# Patient Record
Sex: Female | Born: 1995 | Race: White | Hispanic: No | Marital: Single | Smoking: Never smoker
Health system: Southern US, Community
[De-identification: ages and names within clinical notes are randomized; demographics above are authoritative.]

## PROBLEM LIST (undated history)

## (undated) DIAGNOSIS — L309 Dermatitis, unspecified: Secondary | ICD-10-CM

## (undated) HISTORY — DX: Dermatitis, unspecified: L30.9

---
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
## 2017-04-27 ENCOUNTER — Ambulatory Visit (INDEPENDENT_AMBULATORY_CARE_PROVIDER_SITE_OTHER): Payer: Commercial Managed Care - PPO | Admitting: Allergy

## 2017-04-27 ENCOUNTER — Encounter: Payer: Self-pay | Admitting: Allergy

## 2017-04-27 VITALS — BP 110/70 | HR 95 | Temp 97.6°F | Resp 18 | Ht 61.0 in | Wt 108.8 lb

## 2017-04-27 DIAGNOSIS — R0789 Other chest pain: Secondary | ICD-10-CM | POA: Diagnosis not present

## 2017-04-27 DIAGNOSIS — L508 Other urticaria: Secondary | ICD-10-CM

## 2017-04-27 DIAGNOSIS — T783XXD Angioneurotic edema, subsequent encounter: Secondary | ICD-10-CM

## 2017-04-27 MED ORDER — RANITIDINE HCL 150 MG PO TABS: 150.0000 mg | ORAL_TABLET | Freq: Two times a day (BID) | ORAL | 5 refills | Status: AC

## 2017-04-27 MED ORDER — ALBUTEROL SULFATE HFA 108 (90 BASE) MCG/ACT IN AERS: 2.0000 | INHALATION_SPRAY | Freq: Four times a day (QID) | RESPIRATORY_TRACT | 1 refills | Status: AC | PRN

## 2017-04-27 MED ORDER — CETIRIZINE HCL 10 MG PO TABS: 10.0000 mg | ORAL_TABLET | Freq: Two times a day (BID) | ORAL | 5 refills | Status: AC

## 2017-04-27 NOTE — Progress Notes (Signed)
New Patient Note  RE: Sheila Davidson MRN: [id number] DOB: Feb 23, 1996 Date of Office Visit: 04/27/2017  Referring provider: Liberty Handy, NP Primary care provider: Liberty Handy, NP  Chief Complaint: Hives  History of present illness: Sheila Davidson is a 21 y.o. female presenting today for consultation for hives.  She presents today with her friend.    For past 3 months she reports having hives from "head to toe".  She reports she has never had such severe hives before.  The only thing she reports has helped is steroids.  She has [facility] and [facility] her [facility].     She reports at the start of the hives she only would notice hives at night.  Thus she is thought it may have been related to the detergent she was washing her linen in.  She tried changing her detergents and she still had development of hives.  She states she has tried washing her clothes with just water and she does feel like she has less hives if she does this but it does not completely resolve her symptoms.  She denies any preceding illness, no new foods, no new medications, no sting, no change in soaps/lotions/detergents before the hives started.  Hives last a few hours.  Does not leave any mark or bruising, no fevers.  She does endorse lip/facial and feet swelling as well as joint ache/pain especially hands/fingers.  She has noted that warm showers makes her itchier.   There is pressure component as she tends to have more hives around her bra line.  She also notes that her hives are worse when she is more stressed and anxious which is most of the time.  She has tried zyrtec, Allegra, claritin all one at a time taking 1 tab daily as well a benadryl.   She states this has not done anything to help with her hives.  She has had 2 steroid injections one in August and the other in Sept and has had oral steroids x 3 times.     She does have a history of eczema for which she is a  topical steroid that she does not remember at this time.  She states that the steroid does help when she uses it.     She does report having hives related to bee stings and some medications including penicillin and ciprofloxacin.   She does not have a history of asthma however she has noted some chest tightness sensation since the hives started and she was given an albuterol as she states her PCP noted hives over her neck and wanted her to have the inhaler.  She states she has used the albuterol about 2-3 times with the chest tightness sensation and it has helped to relieve the symptoms.  She denies any nighttime awakenings.  She denies any GI or CV related symptoms alongside the hives.  She has no history of seasonal or environmental allergy and no history of food allergy or concerns for food allergy.  She does eat red meat about twice a week and is unsure if she has been bitten by a tick before.  Review of systems: Review of Systems  Constitutional: Negative for chills, fever and malaise/fatigue.  HENT: Negative for congestion, ear discharge, ear pain, nosebleeds, sinus pain, sore throat and tinnitus.   Eyes: Negative for pain, discharge and redness.  Respiratory: Negative for cough, shortness of breath and wheezing.   Cardiovascular:  Positive for chest pain (Chest tightness sensation).  Gastrointestinal: Negative for abdominal pain, constipation, diarrhea, heartburn, nausea and vomiting.  Musculoskeletal: Positive for joint pain. Negative for back pain, myalgias and neck pain.  Skin: Positive for itching and rash.  Neurological: Negative for headaches.    All other systems negative unless noted above in HPI  Past medical history: Past Medical History:  Diagnosis Date  . Eczema     Past surgical history: History reviewed. No pertinent surgical history.  Family history:  History reviewed. No pertinent family history.  Social history: She lives in an apartment with carpeting with  electric heating and central cooling.  There is a dog, cat and hamster in the home.  There is no concern for water damage, mildew or roaches in the home.  She works as a Psychologist, sport and exercisedesk associate.  She is also a Consulting civil engineerstudent at [employer].  She has no smoking history.   Medication List: Allergies as of 04/27/2017      Reactions   Bee Venom Swelling   Ciprofloxacin Hives   Penicillins Hives      Medication List       Accurate as of 04/27/17  5:02 PM. Always use your most recent med list.          buPROPion 100 MG 12 hr tablet Commonly known as:  WELLBUTRIN SR Take 150 mg by mouth daily.   busPIRone 10 MG tablet Commonly known as:  BUSPAR Take by mouth.   clotrimazole 1 % cream Commonly known as:  LOTRIMIN Apply topically.   lamoTRIgine 100 MG tablet Commonly known as:  LAMICTAL 100mg  orally in the am   traZODone 50 MG tablet Commonly known as:  DESYREL Take 100 mg by mouth daily.       Known medication allergies: Allergies  Allergen Reactions  . Bee Venom Swelling  . Ciprofloxacin Hives  . Penicillins Hives     Physical examination: Blood pressure 110/70, pulse 95, temperature 97.6 F (36.4 C), resp. rate 18, height 5\' 1"  (1.549 m), weight 108 lb 12.8 oz (49.4 kg), SpO2 96 %.  General: Alert, interactive, in no acute distress. HEENT: PERRLA,  TMs pearly gray, turbinates non-edematous without discharge, post-pharynx non erythematous. Neck: Supple without lymphadenopathy. Lungs: Clear to auscultation without wheezing, rhonchi or rales. {no increased work of breathing. CV: Normal S1, S2 without murmurs. Abdomen: Nondistended, nontender. Skin: Scattered erythematous urticarial type lesions primarily located Arms bilaterally, abdomen, back, legs bilaterally , nonvesicular. Extremities:  No clubbing, cyanosis or edema. Neuro:   Grossly intact.  Diagnositics/Labs:   Spirometry: FEV1: 2.47L 82%, FVC: 2.57L  76%  Allergy testing: Deferred due to ongoing  urticaria   Assessment and plan:   Hives and swelling   - at this time etiology of hives and swelling is unknown.  Hives can be caused by a variety of different triggers including illness/infection, foods, medications, stings, exercise, pressure, vibrations, extremes of temperature to name a few however majority of the time there is no identifiable trigger.  Your symptoms have been ongoing for >6 weeks making this chronic thus will obtain labwork to evaluate: CBC w diff, CMP, tryptase, hive panel, environmental panel, alpha-gal panel, inflammatory markers   - start Zyrtec 10mg  twice a day as well as Zantac 150mg  twice a day.  If you still have breakthrough symptoms increase Zyrtec to 2 tabs twice a day (this is the max dose).     - may use benadryl as needed for breakthrough symptoms.    - let  us know if hives and swelling start to leave any marks/bruising or you are having any fevers    - Xolair monthly injection discussed today and potential treatment option of oral medications above are not effective enough in managing symptoms.    Chest tightness   - have access to albuterol inhaler 2 puffs every 4-6 hours as needed for cough/wheeze/shortness of breath/chest tightness.  May use 15-20 minutes prior to activity.   Monitor frequency of use.    Follow-up 1-2 months or sooner if needed   I appreciate the opportunity to take part in Sheila Davidson's care. Please do not hesitate to contact me with questions.  Sincerely,   Margo Aye, MD Allergy/Immunology [facility]

## 2017-04-27 NOTE — Patient Instructions (Addendum)
Hives and swelling   - at this time etiology of hives and swelling is unknown.  Hives can be caused by a variety of different triggers including illness/infection, foods, medications, stings, exercise, pressure, vibrations, extremes of temperature to name a few however majority of the time there is no identifiable trigger.  Your symptoms have been ongoing for >6 weeks making this chronic thus will obtain labwork to evaluate: CBC w diff, CMP, tryptase, hive panel, environmental panel, alpha-gal panel, inflammatory markers   - start Zyrtec 10mg  twice a day as well as Zantac 150mg  twice a day.  If you still have breakthrough symptoms increase Zyrtec to 2 tabs twice a day (this is the max dose).     - may use benadryl as needed for breakthrough symptoms.    - let us know if hives and swelling start to leave any marks/bruising or you are having any fevers    - Xolair monthly injection discussed today and potential treatment option of oral medications above are not effective enough in managing symptoms.    Chest tightness   - have access to albuterol inhaler 2 puffs every 4-6 hours as needed for cough/wheeze/shortness of breath/chest tightness.  May use 15-20 minutes prior to activity.   Monitor frequency of use.    Follow-up 1-2 months or sooner if needed

## 2017-05-04 LAB — IGE+ALLERGENS ZONE 2(30)
Amer Sycamore IgE Qn: <0.1 kU/L
Aspergillus Fumigatus IgE: <0.1 kU/L
Cat Dander IgE: <0.1 kU/L
Cockroach, American IgE: <0.1 kU/L
Common Silver Birch IgE: <0.1 kU/L
D Farinae IgE: <0.1 kU/L
Dog Dander IgE: <0.1 kU/L
IGE (IMMUNOGLOBULIN E), SERUM: 21 [IU]/mL (ref 0–100)
Johnson Grass IgE: <0.1 kU/L
Maple/Box Elder IgE: <0.1 kU/L
Mucor Racemosus IgE: <0.1 kU/L
Mugwort IgE Qn: <0.1 kU/L
Oak, White IgE: <0.1 kU/L
Sheep Sorrel IgE Qn: <0.1 kU/L
Stemphylium Herbarum IgE: <0.1 kU/L
Sweet gum IgE RAST Ql: <0.1 kU/L
Timothy Grass IgE: <0.1 kU/L
White Mulberry IgE: <0.1 kU/L

## 2017-05-04 LAB — ALPHA-GAL PANEL
Beef (Bos spp) IgE: <0.1 kU/L (ref <0.35)
Class Interpretation: 0
Class Interpretation: 0
Lamb/Mutton (Ovis spp) IgE: <0.1 kU/L (ref <0.35)
PORK CLASS INTERPRETATION: 0

## 2017-05-04 LAB — COMPREHENSIVE METABOLIC PANEL
ALBUMIN: 4.2 g/dL (ref 3.5–5.5)
ALT: 24 IU/L (ref 0–32)
AST: 25 IU/L (ref 0–40)
Albumin/Globulin Ratio: 2.2 (ref 1.2–2.2)
Alkaline Phosphatase: 61 IU/L (ref 39–117)
BILIRUBIN TOTAL: 0.5 mg/dL (ref 0.0–1.2)
BUN / CREAT RATIO: 20 (ref 9–23)
BUN: 12 mg/dL (ref 6–20)
CALCIUM: 9.8 mg/dL (ref 8.7–10.2)
CHLORIDE: 104 mmol/L (ref 96–106)
CO2: 25 mmol/L (ref 20–29)
CREATININE: 0.61 mg/dL (ref 0.57–1.00)
GFR, EST AFRICAN AMERICAN: 152 mL/min/{1.73_m2} (ref >59)
GFR, EST NON AFRICAN AMERICAN: 132 mL/min/{1.73_m2} (ref >59)
GLUCOSE: 85 mg/dL (ref 65–99)
Globulin, Total: 1.9 g/dL (ref 1.5–4.5)
Potassium: 4.2 mmol/L (ref 3.5–5.2)
Sodium: 140 mmol/L (ref 134–144)
TOTAL PROTEIN: 6.1 g/dL (ref 6.0–8.5)

## 2017-05-04 LAB — TRYPTASE: Tryptase: 12.5 ug/L (ref 2.2–13.2)

## 2017-05-04 LAB — CBC WITH DIFFERENTIAL/PLATELET
BASOS ABS: 0 10*3/uL (ref 0.0–0.2)
Basos: 0 %
EOS (ABSOLUTE): 0.2 10*3/uL (ref 0.0–0.4)
Eos: 3 %
HEMOGLOBIN: 13.9 g/dL (ref 11.1–15.9)
Hematocrit: 40.2 % (ref 34.0–46.6)
IMMATURE GRANS (ABS): 0 10*3/uL (ref 0.0–0.1)
IMMATURE GRANULOCYTES: 0 %
LYMPHS: 21 %
Lymphocytes Absolute: 1.5 10*3/uL (ref 0.7–3.1)
MCH: 29.8 pg (ref 26.6–33.0)
MCHC: 34.6 g/dL (ref 31.5–35.7)
MCV: 86 fL (ref 79–97)
MONOCYTES: 7 %
Monocytes Absolute: 0.5 10*3/uL (ref 0.1–0.9)
NEUTROS ABS: 5 10*3/uL (ref 1.4–7.0)
NEUTROS PCT: 69 %
PLATELETS: 291 10*3/uL (ref 150–379)
RBC: 4.66 x10E6/uL (ref 3.77–5.28)
RDW: 13.2 % (ref 12.3–15.4)
WBC: 7.2 10*3/uL (ref 3.4–10.8)

## 2017-05-04 LAB — CHRONIC URTICARIA: CU INDEX: 46.3 — AB (ref <10)

## 2017-05-04 LAB — C-REACTIVE PROTEIN: CRP: 11.2 mg/L — AB (ref 0.0–4.9)

## 2017-05-04 LAB — SEDIMENTATION RATE: Sed Rate: 6 mm/hr (ref 0–32)

## 2017-05-23 ENCOUNTER — Telehealth: Payer: Self-pay

## 2017-05-23 NOTE — Telephone Encounter (Signed)
Yes that is ok.  Is she prescribing the Flovent from [facility] or do we need to?

## 2017-05-23 NOTE — Telephone Encounter (Signed)
Verbally advised Dr. Delorse LekPadgett [facility] her.

## 2017-05-23 NOTE — Telephone Encounter (Signed)
The nurse practioner at [facility], Tonya, called to let us know that patient was c/o wheezing and chest tightness. She would like to start the patient on Flovent HFA, but was unsure if it would interfere with the patient starting Xolair. I advised her that we have patients now on both medications. She said okay.

## 2017-10-11 ENCOUNTER — Ambulatory Visit: Payer: Commercial Managed Care - PPO | Admitting: Allergy

## 2018-06-14 IMAGING — US US TRANSVAGINAL NON-OB
1 series · 14 of 25 positions shown · non-contrast
Comparison: None

CLINICAL DATA: Right lower quadrant pelvic pain. Prolonged vaginal
bleeding.



[Series 1: us transvaginal non-ob · 0.22mm/px · 14 of 65 slices shown]
[im 1/65]
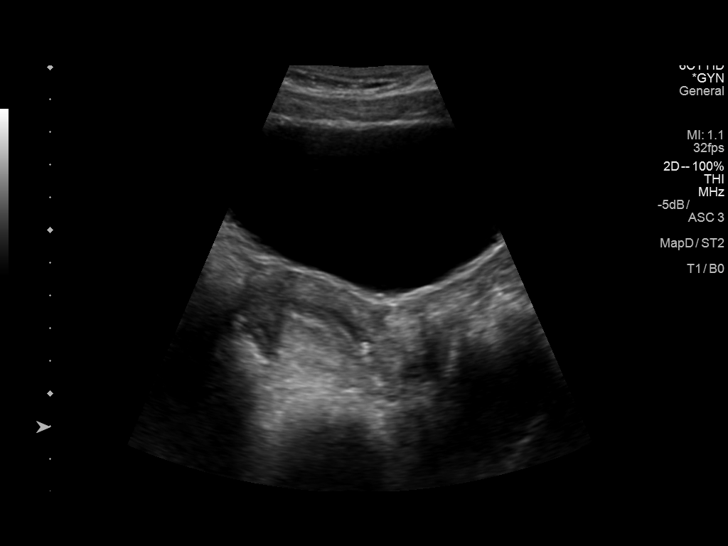
[im 6/65]
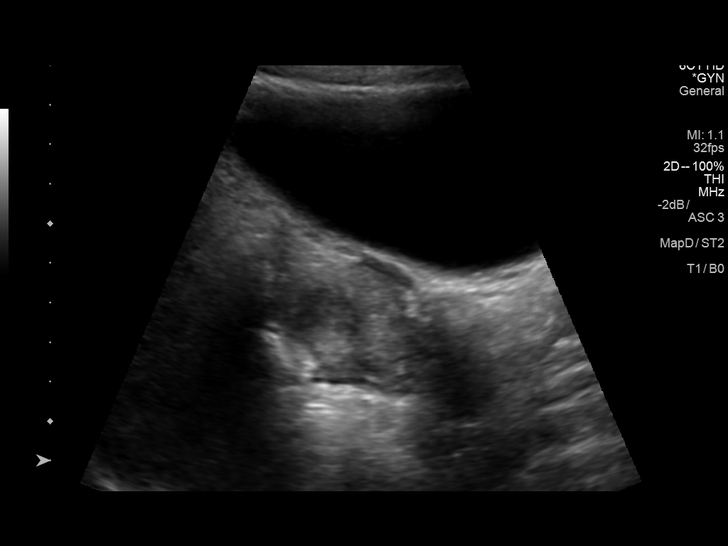
[im 11/65]
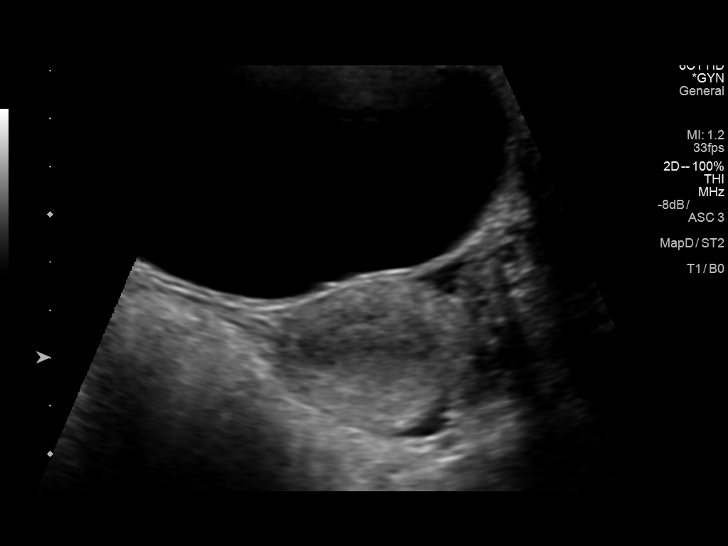
[im 17/65]
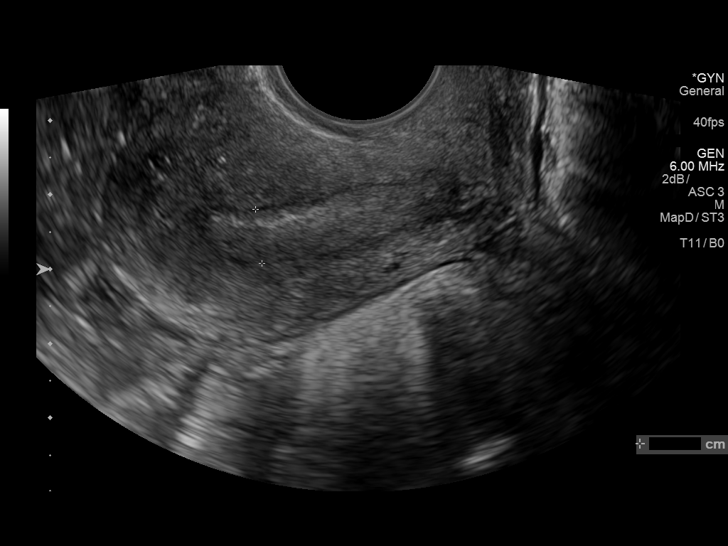
[im 22/65]
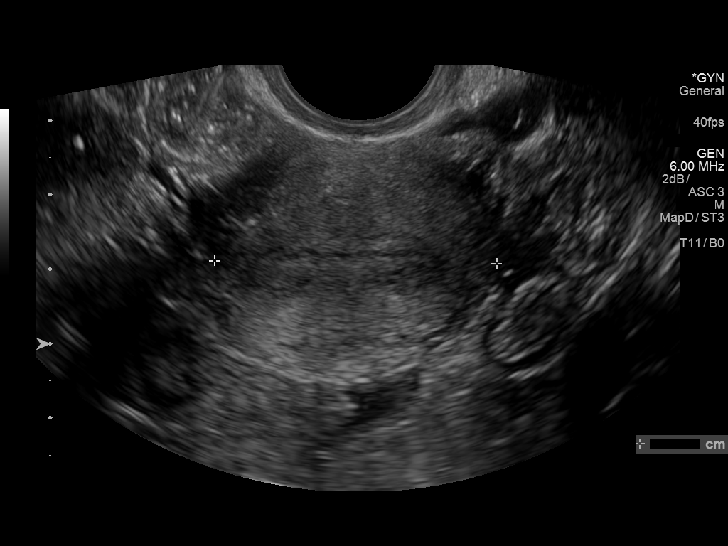
[im 25/65]
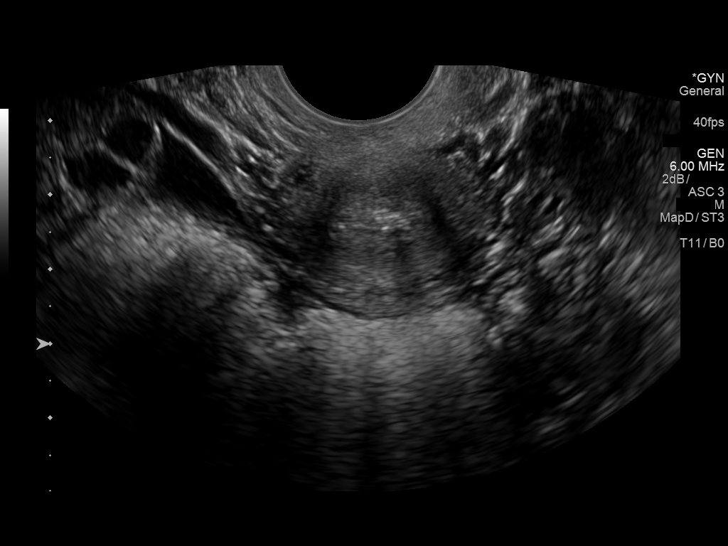
[im 30/65]
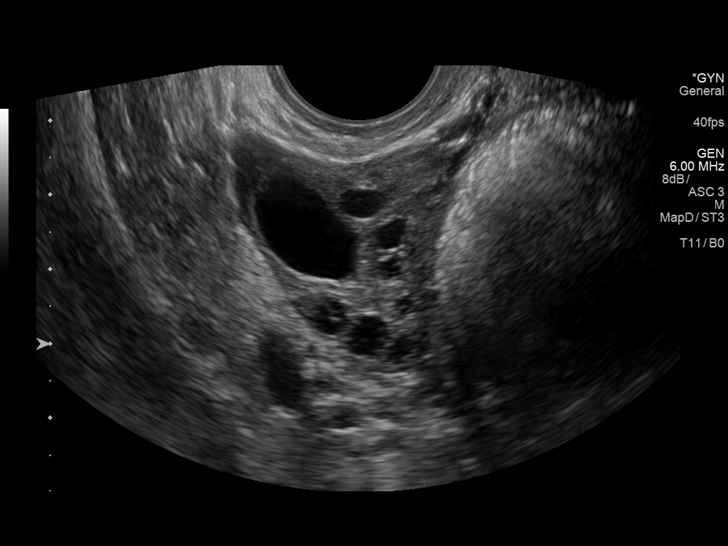
[im 35/65]
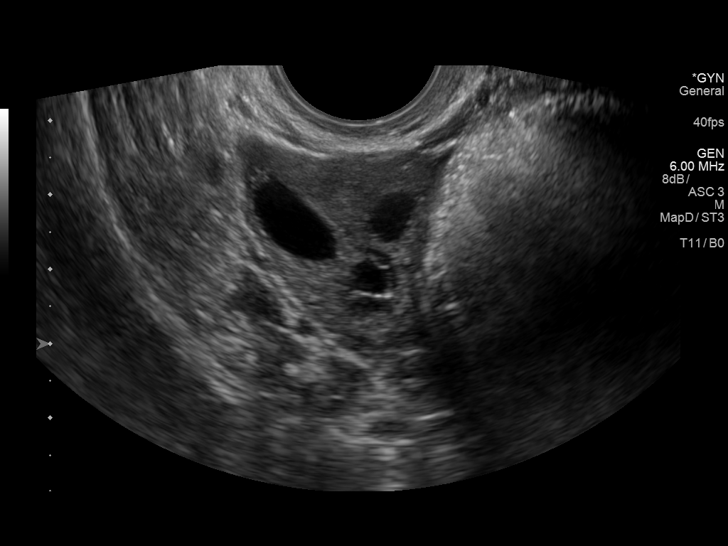
[im 41/65]
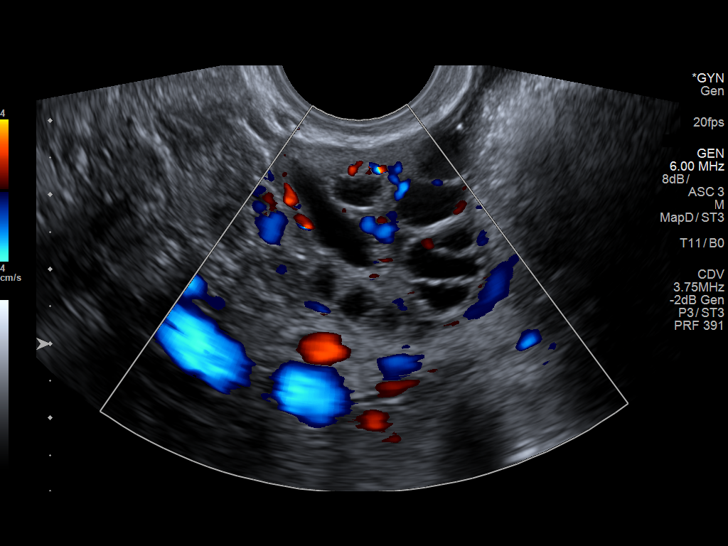
[im 43/65]
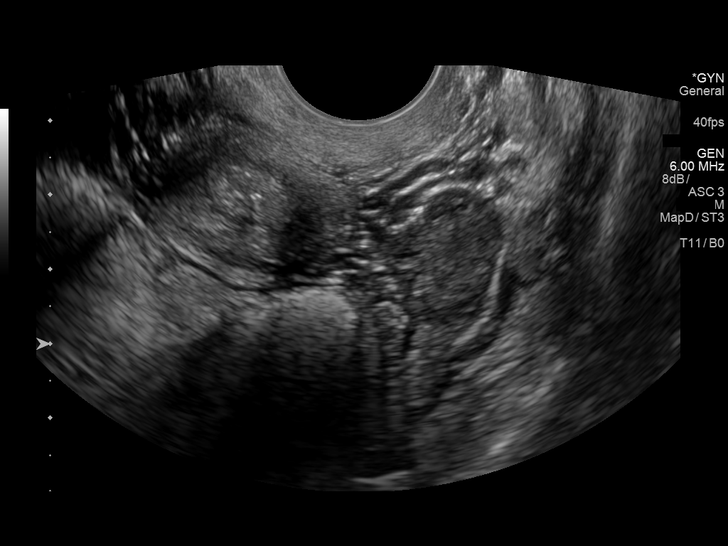
[im 49/65]
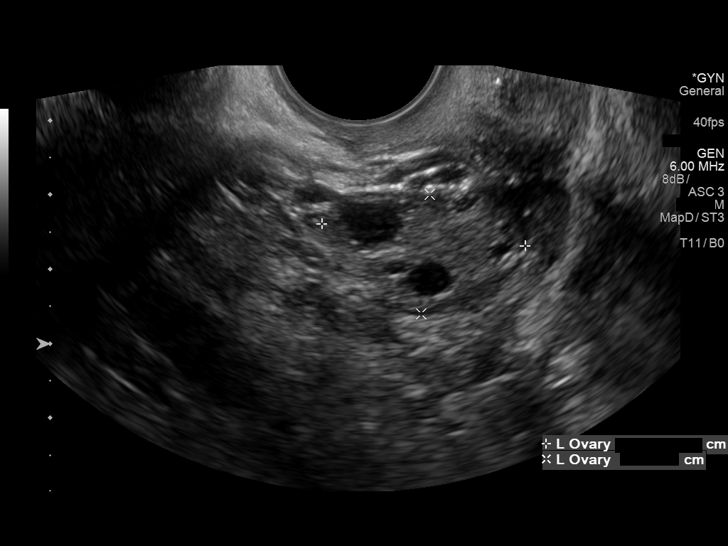
[im 54/65]
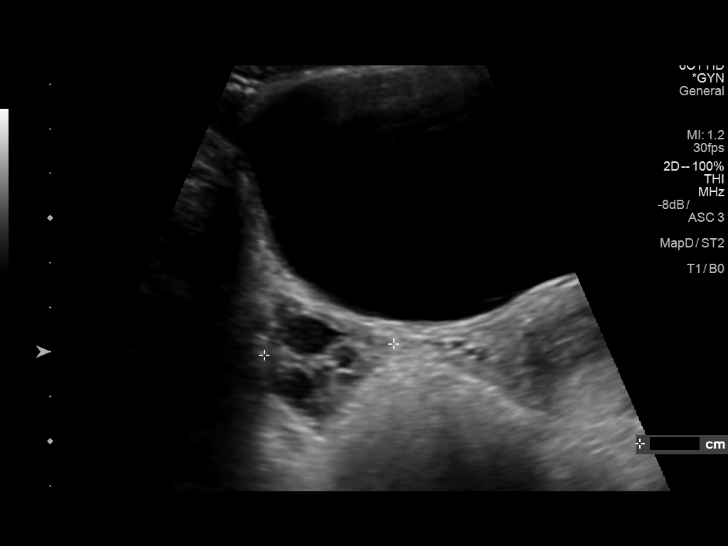
[im 59/65]
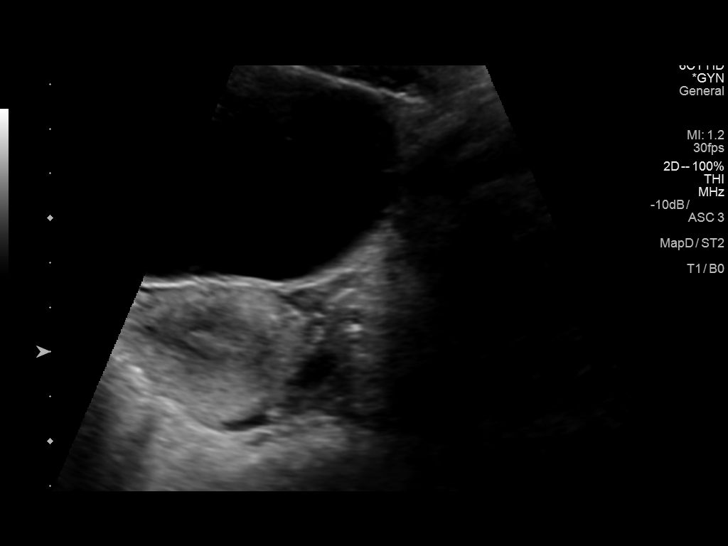
[im 65/65]
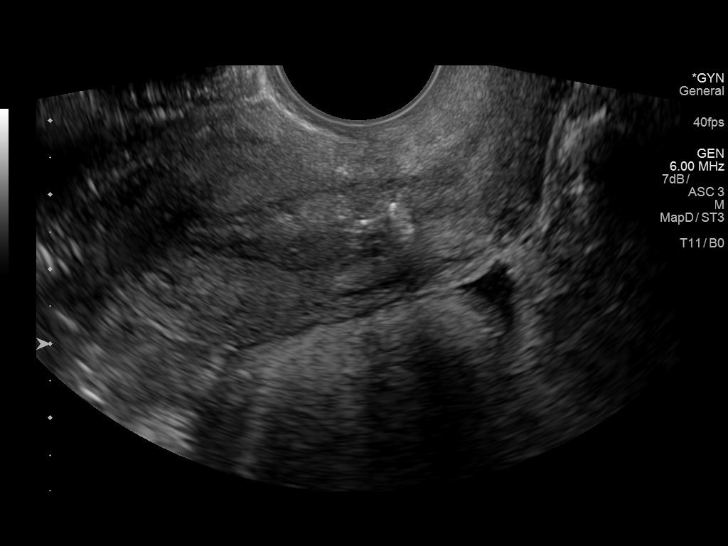

[14 of 25 positions shown; findings below may reference images not displayed]

FINDINGS: Uterus

Measurements: 6.3 x 3.2 x 4.0 cm. No fibroids or other mass
visualized.

Endometrium

Thickness: 7.3 mm. No focal abnormality visualized. IUD seen within
the endometrial canal.

Right ovary

Measurements: 2.7 x 2.3 x 2.8 cm. Normal appearance/no adnexal mass.

Left ovary

Measurements: 2.8 x 1.6 x 1.7 cm. Normal appearance/no adnexal mass.

Other findings

Small amount of free fluid, predominantly adjacent to the right
ovary.
IMPRESSION: Normal appearance of the uterus and ovaries.

Intrauterine device within the endometrial canal.
